# Patient Record
Sex: Female | Born: 1955 | Race: White | Hispanic: No | Smoking: Never smoker
Health system: Southern US, Community
[De-identification: ages and names within clinical notes are randomized; demographics above are authoritative.]

## PROBLEM LIST (undated history)

## (undated) DIAGNOSIS — T7840XA Allergy, unspecified, initial encounter: Secondary | ICD-10-CM

## (undated) DIAGNOSIS — E785 Hyperlipidemia, unspecified: Secondary | ICD-10-CM

## (undated) HISTORY — DX: Hyperlipidemia, unspecified: E78.5

## (undated) HISTORY — DX: Allergy, unspecified, initial encounter: T78.40XA

---

## 2002-11-22 HISTORY — PX: BREAST SURGERY: SHX581

## 2007-04-23 HISTORY — PX: CYSTECTOMY: SUR359

## 2013-02-21 ENCOUNTER — Emergency Department: Payer: Self-pay | Admitting: Emergency Medicine

## 2015-07-03 ENCOUNTER — Encounter: Payer: Self-pay | Admitting: Obstetrics and Gynecology

## 2015-07-10 ENCOUNTER — Ambulatory Visit (INDEPENDENT_AMBULATORY_CARE_PROVIDER_SITE_OTHER): Payer: Self-pay | Admitting: Family Medicine

## 2015-07-10 ENCOUNTER — Encounter (INDEPENDENT_AMBULATORY_CARE_PROVIDER_SITE_OTHER): Payer: Self-pay

## 2015-07-10 ENCOUNTER — Encounter: Payer: Self-pay | Admitting: Family Medicine

## 2015-07-10 VITALS — BP 122/70 | HR 56 | Temp 97.9°F | Resp 19 | Ht 64.0 in | Wt 133.8 lb

## 2015-07-10 DIAGNOSIS — N3001 Acute cystitis with hematuria: Secondary | ICD-10-CM

## 2015-07-10 DIAGNOSIS — J302 Other seasonal allergic rhinitis: Secondary | ICD-10-CM | POA: Insufficient documentation

## 2015-07-10 DIAGNOSIS — E785 Hyperlipidemia, unspecified: Secondary | ICD-10-CM

## 2015-07-10 DIAGNOSIS — N390 Urinary tract infection, site not specified: Secondary | ICD-10-CM | POA: Insufficient documentation

## 2015-07-10 MED ORDER — NITROFURANTOIN MONOHYD MACRO 100 MG PO CAPS: 100.0000 mg | ORAL_CAPSULE | Freq: Two times a day (BID) | ORAL | Status: DC

## 2015-07-10 NOTE — Progress Notes (Signed)
Name: Gloria Huff   MRN: [id number]    DOB: Dec 17, 1955   Date:07/10/2015       Progress Note  Subjective  Chief Complaint  Chief Complaint  Patient presents with  . Acute Visit    NP (Dr. Carman Ching) / Possible UTI  . Hyperlipidemia    Hyperlipidemia This is a chronic problem. The problem is controlled. Recent lipid tests were reviewed and are high. She has no history of chronic renal disease, diabetes or liver disease. Current antihyperlipidemic treatment includes diet change.  Urinary Tract Infection  This is a new problem. The current episode started in the past 7 days. The pain is mild. There has been no fever. Associated symptoms include frequency. Pertinent negatives include no flank pain, hematuria or nausea. She has tried increased fluids for the symptoms. Her past medical history is significant for recurrent UTIs. There is no history of kidney stones.      Past Medical History  Diagnosis Date  . Hyperlipidemia   . Allergy     Past Surgical History  Procedure Laterality Date  . Cystectomy  04/2007    Renal Cyst  . Breast surgery  11/2002    Breast mass  . Cesarean section  02/1980  . Cesarean section  03/1983    History reviewed. No pertinent family history.  Social History   Social History  . Marital Status: Unknown    Spouse Name: N/A  . Number of Children: N/A  . Years of Education: N/A   Occupational History  . Not on file.   Social History Main Topics  . Smoking status: Never Smoker   . Smokeless tobacco: Never Used  . Alcohol Use: No  . Drug Use: No  . Sexual Activity: Not on file   Other Topics Concern  . Not on file   Social History Narrative  . No narrative on file     Current outpatient prescriptions:  .  IRON, IRON,, Take by mouth., Disp: , Rfl:  .  loratadine (CLARITIN) 10 MG tablet, Take by mouth., Disp: , Rfl:  .  Multiple Vitamin tablet, Take 1 tablet by mouth daily., Disp: , Rfl:  .  OMEGA-3 FATTY ACIDS PO, Take 1 tablet by mouth  daily., Disp: , Rfl:  .  Vitamin E 400 UNITS TABS, Take 1 tablet by mouth daily., Disp: , Rfl:   No Known Allergies   Review of Systems  Gastrointestinal: Negative for nausea.  Genitourinary: Positive for frequency. Negative for hematuria and flank pain.      Objective  Filed Vitals:   07/10/15 1113  BP: 122/70  Pulse: 56  Temp: 97.9 F (36.6 C)  TempSrc: Oral  Resp: 19  Height: 5\' 4"  (1.626 m)  Weight: 133 lb 12.8 oz (60.691 kg)  SpO2: 97%    Physical Exam  Constitutional: She is oriented to person, place, and time and well-developed, well-nourished, and in no distress.  Cardiovascular: Normal rate and regular rhythm.   Pulmonary/Chest: Effort normal and breath sounds normal.  Abdominal: Soft. There is tenderness (suprapubic area.) in the suprapubic area. There is no CVA tenderness.  Neurological: She is alert and oriented to person, place, and time.  Skin: Skin is warm and dry.  Nursing note and vitals reviewed.   Assessment & Plan  1. Acute cystitis with hematuria Symptoms consistent with a urinary tract infection. Patient started on Macrobid. Obtain complete urinalysis and microscopic exam for evaluation of hematuria.  - nitrofurantoin, macrocrystal-monohydrate, (MACROBID) 100 MG capsule; Take 1  capsule (100 mg total) by mouth 2 (two) times daily.  Dispense: 14 capsule; Refill: 0 - Urinalysis, Routine w reflex microscopic (not at [facility]) - Urine culture  2. Hyperlipidemia  - Lipid Profile   Amariyon Maynes [facility]ad [facility] 07/10/2015 11:33 AM

## 2015-07-11 LAB — URINALYSIS, ROUTINE W REFLEX MICROSCOPIC
Bilirubin, UA: NEGATIVE
Glucose, UA: NEGATIVE
Ketones, UA: NEGATIVE
Nitrite, UA: NEGATIVE
PH UA: 7 (ref 5.0–7.5)
Protein, UA: NEGATIVE
RBC, UA: NEGATIVE
Specific Gravity, UA: <=1.005 — AB (ref 1.005–1.030)
Urobilinogen, Ur: 0.2 mg/dL (ref 0.2–1.0)

## 2015-07-11 LAB — LIPID PANEL

## 2015-07-11 LAB — MICROSCOPIC EXAMINATION: CASTS: NONE SEEN /LPF

## 2015-07-11 LAB — URINE CULTURE: Organism ID, Bacteria: NO GROWTH

## 2015-07-22 ENCOUNTER — Encounter: Payer: Self-pay | Admitting: Family Medicine

## 2015-07-22 ENCOUNTER — Ambulatory Visit (INDEPENDENT_AMBULATORY_CARE_PROVIDER_SITE_OTHER): Payer: Self-pay | Admitting: Family Medicine

## 2015-07-22 VITALS — BP 118/70 | HR 65 | Temp 98.2°F | Resp 18 | Ht 64.0 in | Wt 130.1 lb

## 2015-07-22 DIAGNOSIS — A499 Bacterial infection, unspecified: Secondary | ICD-10-CM

## 2015-07-22 DIAGNOSIS — N760 Acute vaginitis: Secondary | ICD-10-CM

## 2015-07-22 DIAGNOSIS — B9689 Other specified bacterial agents as the cause of diseases classified elsewhere: Secondary | ICD-10-CM

## 2015-07-22 MED ORDER — METRONIDAZOLE 500 MG PO TABS: 500.0000 mg | ORAL_TABLET | Freq: Two times a day (BID) | ORAL | Status: DC

## 2015-07-22 NOTE — Progress Notes (Signed)
Name: Gloria Huff   MRN: [id number]    DOB: [birth year]   Date:07/22/2015       Progress Note  Subjective  Chief Complaint  Chief Complaint  Patient presents with  . Follow-up    Possible UTI/Vaginal Infection, patient finished antibiotics, same symptoms persist    Abdominal Pain This is a recurrent problem. The pain is located in the suprapubic region. The quality of the pain is burning. The abdominal pain does not radiate. Associated symptoms include frequency. Pertinent negatives include no diarrhea, fever, hematuria, nausea or vomiting. Nothing aggravates the pain. Relieved by: intercourse makes it worse. She has tried antibiotics for the symptoms. The treatment provided no relief.      Past Medical History  Diagnosis Date  . Hyperlipidemia   . Allergy     Past Surgical History  Procedure Laterality Date  . Cystectomy  04/2007    Renal Cyst  . Breast surgery  11/2002    Breast mass  . Cesarean section  02/1980  . Cesarean section  03/1983    History reviewed. No pertinent family history.  Social History   Social History  . Marital Status: Unknown    Spouse Name: N/A  . Number of Children: N/A  . Years of Education: N/A   Occupational History  . Not on file.   Social History Main Topics  . Smoking status: Never Smoker   . Smokeless tobacco: Never Used  . Alcohol Use: No  . Drug Use: No  . Sexual Activity: Not on file   Other Topics Concern  . Not on file   Social History Narrative     Current outpatient prescriptions:  .  IRON, IRON,, Take by mouth., Disp: , Rfl:  .  loratadine (CLARITIN) 10 MG tablet, Take by mouth., Disp: , Rfl:  .  Multiple Vitamin tablet, Take 1 tablet by mouth daily., Disp: , Rfl:  .  OMEGA-3 FATTY ACIDS PO, Take 1 tablet by mouth daily., Disp: , Rfl:  .  Vitamin E 400 UNITS TABS, Take 1 tablet by mouth daily., Disp: , Rfl:  .  nitrofurantoin, macrocrystal-monohydrate, (MACROBID) 100 MG capsule, Take 1 capsule (100 mg total) by  mouth 2 (two) times daily. (Patient not taking: Reported on 07/22/2015), Disp: 14 capsule, Rfl: 0  No Known Allergies   Review of Systems  Constitutional: Negative for fever.  Gastrointestinal: Positive for abdominal pain. Negative for nausea, vomiting and diarrhea.  Genitourinary: Positive for frequency. Negative for hematuria.    Objective  Filed Vitals:   07/22/15 1448  BP: 118/70  Pulse: 65  Temp: 98.2 F (36.8 C)  TempSrc: Oral  Resp: 18  Height:  (1.626 m)  Weight: 130 lb 1.6 oz (59.013 kg)  SpO2: 98%    Physical Exam  Abdominal: Soft. There is tenderness in the periumbilical area and suprapubic area.  Genitourinary: Vagina exhibits no exudate and no lesion. Thin  creamy  fishy  yellow and vaginal discharge found.  Nursing note and vitals reviewed.  Assessment & Plan  1. Bacterial vaginosis Wet prep shows numerous clue cells suggestive of bacterial vaginosis. Patient started on metronidazole. Follow-up in 10 days - metroNIDAZOLE (FLAGYL) 500 MG tablet; Take 1 tablet (500 mg total) by mouth 2 (two) times daily.  Dispense: 14 tablet; Refill: 0   Kendryck Lacroix [facility]ad [facility] 07/22/2015 2:54 PM

## 2015-08-07 ENCOUNTER — Encounter: Payer: Self-pay | Admitting: Family Medicine

## 2015-08-07 ENCOUNTER — Ambulatory Visit (INDEPENDENT_AMBULATORY_CARE_PROVIDER_SITE_OTHER): Payer: Self-pay | Admitting: Family Medicine

## 2015-08-07 ENCOUNTER — Other Ambulatory Visit: Payer: Self-pay

## 2015-08-07 VITALS — BP 106/60 | HR 84 | Temp 98.8°F | Resp 16 | Ht 64.0 in | Wt 131.1 lb

## 2015-08-07 DIAGNOSIS — L920 Granuloma annulare: Secondary | ICD-10-CM | POA: Insufficient documentation

## 2015-08-07 DIAGNOSIS — N760 Acute vaginitis: Principal | ICD-10-CM

## 2015-08-07 DIAGNOSIS — D649 Anemia, unspecified: Secondary | ICD-10-CM | POA: Insufficient documentation

## 2015-08-07 DIAGNOSIS — Z23 Encounter for immunization: Secondary | ICD-10-CM

## 2015-08-07 DIAGNOSIS — N3000 Acute cystitis without hematuria: Secondary | ICD-10-CM

## 2015-08-07 DIAGNOSIS — A499 Bacterial infection, unspecified: Secondary | ICD-10-CM

## 2015-08-07 DIAGNOSIS — B9689 Other specified bacterial agents as the cause of diseases classified elsewhere: Secondary | ICD-10-CM

## 2015-08-07 LAB — POCT URINALYSIS DIPSTICK
Bilirubin, UA: NEGATIVE
Blood, UA: NEGATIVE
GLUCOSE UA: NEGATIVE
Ketones, UA: NEGATIVE
Nitrite, UA: NEGATIVE
Protein, UA: NEGATIVE
SPEC GRAV UA: 1.015
UROBILINOGEN UA: NEGATIVE
pH, UA: 7

## 2015-08-07 MED ORDER — TINIDAZOLE 250 MG PO TABS: 2.0000 g | ORAL_TABLET | Freq: Every day | ORAL | Status: DC

## 2015-08-07 MED ORDER — CIPROFLOXACIN HCL 500 MG PO TABS: 500.0000 mg | ORAL_TABLET | Freq: Two times a day (BID) | ORAL | Status: DC

## 2015-08-07 NOTE — Progress Notes (Signed)
Name: Gloria Huff   MRN: [id number]    DOB: November 05, 1956   Date:08/07/2015       Progress Note  Subjective  Chief Complaint  Chief Complaint  Patient presents with  . Vaginitis    pt had bacteria vaginosis infection(07/22/15 was last visit) and states that she is still having symptoms pt states that she is still ahving burning and chaffing feeling in vaginal area    HPI  Bacterial vaginosis bacterial vaginosis  Patient had an episode of bacterial vaginosis treated approximately 3 weeks ago with Flagyl. Her symptomatology never completely resolved she continues with vaginal discharge and odor and irritation.  Past Medical History  Diagnosis Date  . Hyperlipidemia   . Allergy     Social History  Substance Use Topics  . Smoking status: Never Smoker   . Smokeless tobacco: Never Used  . Alcohol Use: No     Current outpatient prescriptions:  .  IRON, IRON,, Take by mouth., Disp: , Rfl:  .  loratadine (CLARITIN) 10 MG tablet, Take by mouth., Disp: , Rfl:  .  Multiple Vitamin tablet, Take 1 tablet by mouth daily., Disp: , Rfl:  .  OMEGA-3 FATTY ACIDS PO, Take 1 tablet by mouth daily., Disp: , Rfl:  .  Vitamin E 400 UNITS TABS, Take 1 tablet by mouth daily., Disp: , Rfl:   No Known Allergies  Review of Systems  Constitutional: Negative.   Gastrointestinal: Positive for abdominal pain ( some suprapubic pain as noted).  Genitourinary: Positive for dysuria, urgency and frequency. Negative for flank pain.  Musculoskeletal: Negative for myalgias and neck pain.     Objective  Filed Vitals:   08/07/15 0833  BP: 106/60  Pulse: 84  Temp: 98.8 F (37.1 C)  Resp: 16  Height:  (1.626 m)  Weight: 131 lb 2 oz (59.478 kg)  SpO2: 99%     Physical Exam  Constitutional: She is well-developed, well-nourished, and in no distress.  HENT:  Head: Normocephalic.  Eyes: Pupils are equal, round, and reactive to light.  Neck: Normal range of motion.  Cardiovascular: Normal rate.    Pulmonary/Chest: Effort normal and breath sounds normal.  Abdominal: Soft. There is tenderness ( 6 suprapubic).  Musculoskeletal: Normal range of motion.  Skin: Skin is warm and dry.      Assessment & Plan   1. Bacterial vaginosis Recurrent and unresponsive to Flagyl - POCT Urinalysis Dipstick - tinidazole (TINDAMAX) 250 MG tablet; Take 8 tablets (2,000 mg total) by mouth daily with breakfast.  Dispense: 5 tablet; Refill: 5  2. Acute cystitis without hematuria Treatment with Cipro - POCT Urinalysis Dipstick - ciprofloxacin (CIPRO) 500 MG tablet; Take 1 tablet (500 mg total) by mouth 2 (two) times daily.  Dispense: 10 tablet; Refill: 0  3. Need for influenza vaccination Given today - Flu Vaccine QUAD 36+ mos PF IM (Fluarix & Fluzone Quad PF)

## 2015-08-07 NOTE — Patient Instructions (Signed)
Bacterial Vaginosis Bacterial vaginosis is a vaginal infection that occurs when the normal balance of bacteria in the vagina is disrupted. It results from an overgrowth of certain bacteria. This is the most common vaginal infection in women of childbearing age. Treatment is important to prevent complications, especially in pregnant women, as it can cause a premature delivery. CAUSES  Bacterial vaginosis is caused by an increase in harmful bacteria that are normally present in smaller amounts in the vagina. Several different kinds of bacteria can cause bacterial vaginosis. However, the reason that the condition develops is not fully understood. RISK FACTORS Certain activities or behaviors can put you at an increased risk of developing bacterial vaginosis, including:  Having a new sex partner or multiple sex partners.  Douching.  Using an intrauterine device (IUD) for contraception. Women do not get bacterial vaginosis from toilet seats, bedding, swimming pools, or contact with objects around them. SIGNS AND SYMPTOMS  Some women with bacterial vaginosis have no signs or symptoms. Common symptoms include:  Grey vaginal discharge.  A fishlike odor with discharge, especially after sexual intercourse.  Itching or burning of the vagina and vulva.  Burning or pain with urination. DIAGNOSIS  Your health care provider will take a medical history and examine the vagina for signs of bacterial vaginosis. A sample of vaginal fluid may be taken. Your health care provider will look at this sample under a microscope to check for bacteria and abnormal cells. A vaginal pH test may also be done.  TREATMENT  Bacterial vaginosis may be treated with antibiotic medicines. These may be given in the form of a pill or a vaginal cream. A second round of antibiotics may be prescribed if the condition comes back after treatment.  HOME CARE INSTRUCTIONS   Only take over-the-counter or prescription medicines as  directed by your health care provider.  If antibiotic medicine was prescribed, take it as directed. Make sure you finish it even if you start to feel better.  Do not have sex until treatment is completed.  Tell all sexual partners that you have a vaginal infection. They should see their health care provider and be treated if they have problems, such as a mild rash or itching.  Practice safe sex by using condoms and only having one sex partner. SEEK MEDICAL CARE IF:   Your symptoms are not improving after 3 days of treatment.  You have increased discharge or pain.  You have a fever. MAKE SURE YOU:   Understand these instructions.  Will watch your condition.  Will get help right away if you are not doing well or get worse. FOR MORE INFORMATION  Centers for Disease Control and Prevention, Division of STD Prevention: www.cdc.gov/std American Sexual Health Association (ASHA): www.ashastd.org  Document Released: 11/08/2005 Document Revised: 08/29/2013 Document Reviewed: 06/20/2013 ExitCare Patient Information 2015 [employer]. This information is not intended to replace advice given to you by your health care provider. Make sure you discuss any questions you have with your health care provider.  

## 2015-08-14 ENCOUNTER — Telehealth: Payer: Self-pay | Admitting: Family Medicine

## 2015-08-14 NOTE — Telephone Encounter (Signed)
Routed to Dr. Sherryll Burger patient would like advice concerning her vaginal infection

## 2015-08-14 NOTE — Telephone Encounter (Signed)
Pt would like a call back in regards to the vaginal bacterial infection.

## 2015-08-17 NOTE — Telephone Encounter (Signed)
Patient

## 2015-08-17 NOTE — Telephone Encounter (Signed)
Patient is still experiencing vaginal irritation and burning. She has finished metronidazole and ciprofloxacin. She has an appointment scheduled with gynecology for further evaluation and follow-up.

## 2015-08-20 NOTE — Telephone Encounter (Signed)
Dr. Sherryll Burger has spoken with patient and she has an appointment scheduled with gynecology for further evaluation and follow up

## 2015-09-04 ENCOUNTER — Encounter: Payer: Self-pay | Admitting: *Deleted

## 2015-09-04 ENCOUNTER — Ambulatory Visit (INDEPENDENT_AMBULATORY_CARE_PROVIDER_SITE_OTHER): Payer: Self-pay | Admitting: Family Medicine

## 2015-09-04 ENCOUNTER — Encounter: Payer: Self-pay | Admitting: Family Medicine

## 2015-09-04 VITALS — BP 110/70 | HR 81 | Temp 98.6°F | Resp 18 | Ht 64.0 in | Wt 129.7 lb

## 2015-09-04 DIAGNOSIS — K644 Residual hemorrhoidal skin tags: Secondary | ICD-10-CM | POA: Insufficient documentation

## 2015-09-04 DIAGNOSIS — K648 Other hemorrhoids: Secondary | ICD-10-CM

## 2015-09-04 DIAGNOSIS — R198 Other specified symptoms and signs involving the digestive system and abdomen: Secondary | ICD-10-CM

## 2015-09-04 DIAGNOSIS — K6289 Other specified diseases of anus and rectum: Secondary | ICD-10-CM | POA: Insufficient documentation

## 2015-09-04 NOTE — Progress Notes (Signed)
Name: Gloria Huff   MRN: [id number]    DOB: 05-02-1956   Date:09/04/2015       Progress Note  Subjective  Chief Complaint  Chief Complaint  Patient presents with  . Rectal Problems    discomfort    HPI  Rectal Discomfort Pt. Here for symptoms of rectal discomfort. Previously, she has been treated for UTI, Bacterial Vaginosis, and was referred to GYN for persistent pelvic discomfort. She has been treated with Diflucan and Hydrocortisone suppository by GYN. She feels a burning sensation in her rectum, feeling bloated and gassy. She denies any blood or mucus in stool. She feels more discomfort in her rectal area when she is on her feet. She has history of internal hemorrhoids and had the banding done in December 2015.  Past Medical History  Diagnosis Date  . Hyperlipidemia   . Allergy     Past Surgical History  Procedure Laterality Date  . Cystectomy  04/2007    Renal Cyst  . Breast surgery  11/2002    Breast mass  . Cesarean section  02/1980  . Cesarean section  03/1983    History reviewed. No pertinent family history.  Social History   Social History  . Marital Status: Unknown    Spouse Name: N/A  . Number of Children: N/A  . Years of Education: N/A   Occupational History  . Not on file.   Social History Main Topics  . Smoking status: Never Smoker   . Smokeless tobacco: Never Used  . Alcohol Use: No  . Drug Use: No  . Sexual Activity: Not on file   Other Topics Concern  . Not on file   Social History Narrative    Current outpatient prescriptions:  .  estradiol (ESTRACE) 0.1 MG/GM vaginal cream, Place 1 Applicatorful vaginally at bedtime., Disp: , Rfl:  .  hydrocortisone (ANUSOL-HC) 25 MG suppository, Place 25 mg rectally 2 (two) times daily., Disp: , Rfl:  .  IRON, IRON,, Take by mouth., Disp: , Rfl:  .  loratadine (CLARITIN) 10 MG tablet, Take by mouth., Disp: , Rfl:  .  Multiple Vitamin tablet, Take 1 tablet by mouth daily., Disp: , Rfl:  .  OMEGA-3  FATTY ACIDS PO, Take 1 tablet by mouth daily., Disp: , Rfl:  .  Vitamin E 400 UNITS TABS, Take 1 tablet by mouth daily., Disp: , Rfl:   No Known Allergies   Review of Systems  Constitutional: Negative for fever and chills.  Gastrointestinal: Positive for abdominal pain (pelvic discomfort and bloating.) and constipation. Negative for heartburn, nausea, vomiting, diarrhea and blood in stool.  Genitourinary: Negative for dysuria, urgency and hematuria.    Objective  Filed Vitals:   09/04/15 0910  BP: 110/70  Pulse: 81  Temp: 98.6 F (37 C)  TempSrc: Oral  Resp: 18  Height:  (1.626 m)  Weight: 129 lb 11.2 oz (58.832 kg)  SpO2: 97%    Physical Exam  Constitutional: She is well-developed, well-nourished, and in no distress.  Genitourinary: Rectal exam shows external hemorrhoid and mass. Guaiac negative stool.  Upon DRE, a smooth mass-like structure palpated at the 2 o' clock position, non-tender on palpation, no bleeding. Multiple non-thrombosed external hemorrhoids.  Nursing note and vitals reviewed.  Assessment & Plan  1. External hemorrhoids Referral to Gen. surgery for evaluation and management of external hemorrhoids. - Ambulatory referral to General Surgery  2. Rectal mass Referral to gastroenterology for evaluation of a mass palpated on DRE. - Ambulatory  referral to Gastroenterology - POC Hemoccult Bld/Stl (1-Cd Office Dx)   Kathi LudwigSyed Asad A. [facility] 09/04/2015 9:35 AM

## 2015-09-16 ENCOUNTER — Ambulatory Visit: Payer: Self-pay | Admitting: General Surgery

## 2015-10-22 ENCOUNTER — Encounter: Payer: Self-pay | Admitting: *Deleted

## 2016-08-10 ENCOUNTER — Other Ambulatory Visit: Payer: Self-pay | Admitting: Orthopedic Surgery

## 2016-08-10 DIAGNOSIS — M25561 Pain in right knee: Secondary | ICD-10-CM

## 2016-08-11 ENCOUNTER — Ambulatory Visit
Admission: RE | Admit: 2016-08-11 | Discharge: 2016-08-11 | Disposition: A | Discharge status: Home / Self Care | Payer: No Typology Code available for payment source | Source: Ambulatory Visit | Attending: Orthopedic Surgery | Admitting: Orthopedic Surgery

## 2016-08-11 DIAGNOSIS — M25561 Pain in right knee: Secondary | ICD-10-CM

## 2016-08-12 ENCOUNTER — Other Ambulatory Visit: Payer: Self-pay | Admitting: Orthopedic Surgery

## 2016-08-12 DIAGNOSIS — M25561 Pain in right knee: Secondary | ICD-10-CM

## 2016-08-19 ENCOUNTER — Ambulatory Visit: Payer: Self-pay | Admitting: Anesthesiology

## 2016-08-19 ENCOUNTER — Encounter
Admission: RE | Disposition: A | Discharge status: Home / Self Care | Payer: Self-pay | Source: Ambulatory Visit | Attending: Orthopedic Surgery

## 2016-08-19 ENCOUNTER — Ambulatory Visit
Admission: RE | Admit: 2016-08-19 | Discharge: 2016-08-19 | Disposition: A | Discharge status: Home / Self Care | Payer: Self-pay | Source: Ambulatory Visit | Attending: Orthopedic Surgery | Admitting: Orthopedic Surgery

## 2016-08-19 DIAGNOSIS — M1711 Unilateral primary osteoarthritis, right knee: Secondary | ICD-10-CM | POA: Insufficient documentation

## 2016-08-19 DIAGNOSIS — E785 Hyperlipidemia, unspecified: Secondary | ICD-10-CM | POA: Insufficient documentation

## 2016-08-19 HISTORY — PX: KNEE ARTHROSCOPY: SHX127

## 2016-08-19 SURGERY — ARTHROSCOPY, KNEE
Anesthesia: General | Site: Knee | Laterality: Right | Wound class: Clean

## 2016-08-19 MED ORDER — MIDAZOLAM HCL 2 MG/2ML IJ SOLN
INTRAMUSCULAR | Status: DC | PRN
  Administered 2016-08-19: 1 mg via INTRAVENOUS

## 2016-08-19 MED ORDER — FENTANYL CITRATE (PF) 100 MCG/2ML IJ SOLN
INTRAMUSCULAR | Status: AC
  Administered 2016-08-19: 25 ug via INTRAVENOUS
  Filled 2016-08-19: qty 2

## 2016-08-19 MED ORDER — BUPIVACAINE-EPINEPHRINE (PF) 0.5% -1:200000 IJ SOLN
INTRAMUSCULAR | Status: DC | PRN
  Administered 2016-08-19: 30 mL

## 2016-08-19 MED ORDER — BUPIVACAINE-EPINEPHRINE (PF) 0.5% -1:200000 IJ SOLN
INTRAMUSCULAR | Status: AC
  Filled 2016-08-19: qty 10

## 2016-08-19 MED ORDER — FENTANYL CITRATE (PF) 100 MCG/2ML IJ SOLN
25.0000 ug | INTRAMUSCULAR | Status: DC | PRN
  Administered 2016-08-19 (×3): 25 ug via INTRAVENOUS

## 2016-08-19 MED ORDER — CEFAZOLIN SODIUM-DEXTROSE 2-4 GM/100ML-% IV SOLN
INTRAVENOUS | Status: AC
  Filled 2016-08-19: qty 100

## 2016-08-19 MED ORDER — ONDANSETRON HCL 4 MG/2ML IJ SOLN
INTRAMUSCULAR | Status: DC | PRN
  Administered 2016-08-19: 4 mg via INTRAVENOUS

## 2016-08-19 MED ORDER — HYDROCODONE-ACETAMINOPHEN 5-325 MG PO TABS
ORAL_TABLET | ORAL | Status: AC
  Filled 2016-08-19: qty 1

## 2016-08-19 MED ORDER — ONDANSETRON HCL 4 MG/2ML IJ SOLN: 4.0000 mg | Freq: Once | INTRAMUSCULAR | Status: DC | PRN

## 2016-08-19 MED ORDER — HYDROCODONE-ACETAMINOPHEN 5-325 MG PO TABS: 1.0000 | ORAL_TABLET | ORAL | 0 refills | Status: AC | PRN

## 2016-08-19 MED ORDER — DEXAMETHASONE SODIUM PHOSPHATE 10 MG/ML IJ SOLN
INTRAMUSCULAR | Status: DC | PRN
  Administered 2016-08-19: 10 mg via INTRAVENOUS

## 2016-08-19 MED ORDER — CEFAZOLIN SODIUM-DEXTROSE 2-4 GM/100ML-% IV SOLN
2.0000 g | Freq: Once | INTRAVENOUS | Status: AC
  Administered 2016-08-19: 2 g via INTRAVENOUS

## 2016-08-19 MED ORDER — LACTATED RINGERS IV SOLN
INTRAVENOUS | Status: DC
  Administered 2016-08-19: 15:00:00 via INTRAVENOUS

## 2016-08-19 MED ORDER — FENTANYL CITRATE (PF) 100 MCG/2ML IJ SOLN
INTRAMUSCULAR | Status: DC | PRN
  Administered 2016-08-19 (×2): 50 ug via INTRAVENOUS

## 2016-08-19 MED ORDER — HYDROCODONE-ACETAMINOPHEN 5-325 MG PO TABS
1.0000 | ORAL_TABLET | ORAL | Status: DC | PRN
  Administered 2016-08-19: 1 via ORAL

## 2016-08-19 SURGICAL SUPPLY — 28 items
BANDAGE ACE 4X5 VEL STRL LF (GAUZE/BANDAGES/DRESSINGS) IMPLANT
BANDAGE ELASTIC 4 LF NS (GAUZE/BANDAGES/DRESSINGS) ×3 IMPLANT
BLADE FULL RADIUS 3.5 (BLADE) IMPLANT
BLADE INCISOR PLUS 4.5 (BLADE) ×3 IMPLANT
BLADE SHAVER 4.5 DBL SERAT CV (CUTTER) IMPLANT
BLADE SHAVER 4.5X7 STR FR (MISCELLANEOUS) IMPLANT
CHLORAPREP W/TINT 26ML (MISCELLANEOUS) ×3 IMPLANT
CUFF TOURN 24 STER (MISCELLANEOUS) ×3 IMPLANT
CUFF TOURN 30 STER DUAL PORT (MISCELLANEOUS) IMPLANT
GAUZE SPONGE 4X4 12PLY STRL (GAUZE/BANDAGES/DRESSINGS) ×3 IMPLANT
GLOVE SURG SYN 9.0  PF PI (GLOVE) ×2
GLOVE SURG SYN 9.0 PF PI (GLOVE) ×1 IMPLANT
GOWN SRG 2XL LVL 4 RGLN SLV (GOWNS) ×1 IMPLANT
GOWN STRL NON-REIN 2XL LVL4 (GOWNS) ×2
GOWN STRL REUS W/ TWL LRG LVL3 (GOWN DISPOSABLE) ×2 IMPLANT
GOWN STRL REUS W/TWL LRG LVL3 (GOWN DISPOSABLE) ×4
IV LACTATED RINGER IRRG 3000ML (IV SOLUTION) ×4
IV LR IRRIG 3000ML ARTHROMATIC (IV SOLUTION) ×2 IMPLANT
KIT RM TURNOVER STRD PROC AR (KITS) ×3 IMPLANT
MANIFOLD NEPTUNE II (INSTRUMENTS) ×3 IMPLANT
PACK ARTHROSCOPY KNEE (MISCELLANEOUS) ×3 IMPLANT
SET TUBE SUCT SHAVER OUTFL 24K (TUBING) ×3 IMPLANT
SET TUBE TIP INTRA-ARTICULAR (MISCELLANEOUS) ×3 IMPLANT
SUT ETHILON 4-0 (SUTURE) ×2
SUT ETHILON 4-0 FS2 18XMFL BLK (SUTURE) ×1
SUTURE ETHLN 4-0 FS2 18XMF BLK (SUTURE) ×1 IMPLANT
TUBING ARTHRO INFLOW-ONLY STRL (TUBING) ×3 IMPLANT
WAND HAND CNTRL MULTIVAC 50 (MISCELLANEOUS) ×3 IMPLANT

## 2016-08-19 NOTE — H&P (Signed)
Reviewed paper H+P, will be scanned into chart. No changes noted.  

## 2016-08-19 NOTE — Anesthesia Preprocedure Evaluation (Signed)
Anesthesia Evaluation  Patient identified by MRN, date of birth, ID band Patient awake    Reviewed: Allergy & Precautions, NPO status , Patient's Chart, lab work & pertinent test results  History of Anesthesia Complications Negative for: history of anesthetic complications  Airway Mallampati: II       Dental   Pulmonary neg pulmonary ROS,           Cardiovascular negative cardio ROS       Neuro/Psych negative neurological ROS     GI/Hepatic negative GI ROS, Neg liver ROS,   Endo/Other  negative endocrine ROS  Renal/GU negative Renal ROS     Musculoskeletal   Abdominal   Peds  Hematology negative hematology ROS (+) anemia ,   Anesthesia Other Findings   Reproductive/Obstetrics                             Anesthesia Physical Anesthesia Plan  ASA: I  Anesthesia Plan: General   Post-op Pain Management:    Induction: Intravenous  Airway Management Planned: LMA  Additional Equipment:   Intra-op Plan:   Post-operative Plan:   Informed Consent: I have reviewed the patients History and Physical, chart, labs and discussed the procedure including the risks, benefits and alternatives for the proposed anesthesia with the patient or authorized representative who has indicated his/her understanding and acceptance.     Plan Discussed with:   Anesthesia Plan Comments:         Anesthesia Quick Evaluation

## 2016-08-19 NOTE — Transfer of Care (Signed)
Immediate Anesthesia Transfer of Care Note  Patient: Gloria BrockLinda M Bevard  Procedure(s) Performed: Procedure(s): ARTHROSCOPY KNEE / REMOVAL OF LOOSE BODY AND LATERAL RELEASE (Right)  Patient Location: PACU  Anesthesia Type:General  Level of Consciousness: awake  Airway & Oxygen Therapy: Patient Spontanous Breathing and Patient connected to face mask oxygen  Post-op Assessment: Report given to RN and Post -op Vital signs reviewed and stable  Post vital signs: Reviewed  Last Vitals:  Vitals:   08/19/16 1433 08/19/16 1726  BP: 127/75 121/71  Pulse: 69 (!) 57  Resp: 16 10  Temp: 37.5 C     Last Pain:  Vitals:   08/19/16 1433  TempSrc: Tympanic  PainSc: 3          Complications: No apparent anesthesia complications

## 2016-08-19 NOTE — Anesthesia Procedure Notes (Signed)
Procedure Name: LMA Insertion Date/Time: 08/19/2016 4:23 PM Performed by: Omer JackWEATHERLY, Gloria Vandenheuvel Pre-anesthesia Checklist: Patient identified, Patient being monitored, Timeout performed, Emergency Drugs available and Suction available Patient Re-evaluated:Patient Re-evaluated prior to inductionOxygen Delivery Method: Circle system utilized Preoxygenation: Pre-oxygenation with 100% oxygen Intubation Type: IV induction Ventilation: Mask ventilation without difficulty LMA: LMA inserted LMA Size: 4.0 Tube type: Oral Number of attempts: 1 Placement Confirmation: positive ETCO2 and breath sounds checked- equal and bilateral Tube secured with: Tape Dental Injury: Teeth and Oropharynx as per pre-operative assessment

## 2016-08-19 NOTE — Progress Notes (Signed)
Elevated leg on pillows  

## 2016-08-19 NOTE — Anesthesia Postprocedure Evaluation (Signed)
Anesthesia Post Note  Patient: Gloria Huff  Procedure(s) Performed: Procedure(s) (LRB): ARTHROSCOPY KNEE / REMOVAL OF LOOSE BODY AND LATERAL RELEASE (Right)  Patient location during evaluation: PACU Anesthesia Type: General Level of consciousness: awake and alert Pain management: pain level controlled Vital Signs Assessment: post-procedure vital signs reviewed and stable Respiratory status: spontaneous breathing, nonlabored ventilation, respiratory function stable and patient connected to nasal cannula oxygen Cardiovascular status: blood pressure returned to baseline and stable Postop Assessment: no signs of nausea or vomiting Anesthetic complications: no    Last Vitals:  Vitals:   08/19/16 1751 08/19/16 1756  BP:  133/75  Pulse: 71 70  Resp: 15 17  Temp:  37.3 C    Last Pain:  Vitals:   08/19/16 1756  TempSrc:   PainSc: 3                  Cleda MccreedyJoseph K Piscitello

## 2016-08-19 NOTE — Op Note (Signed)
08/19/2016  5:27 PM  PATIENT:  Gloria BrockLinda M Huff  60 y.o. female  PRE-OPERATIVE DIAGNOSIS:  mechanical knee pain.loose body of right knee,osteoarthritis  POST-OPERATIVE DIAGNOSIS:  mechanical knee pain.loose body of right knee,osteoarthritis  PROCEDURE:  Procedure(s): ARTHROSCOPY KNEE / REMOVAL OF LOOSE BODY AND LATERAL RELEASE (Right)  SURGEON: Gloria SchullerMichael J Margeart Allender, MD  ASSISTANTS: None  ANESTHESIA:   general  EBL:  Total I/O In: 400 [I.V.:400] Out: -   BLOOD ADMINISTERED:none  DRAINS: none   LOCAL MEDICATIONS USED:  MARCAINE     SPECIMEN:  No Specimen  DISPOSITION OF SPECIMEN:  N/A  COUNTS:  YES  TOURNIQUET:   26 minutes at 300 mmHg  IMPLANTS: None  DICTATION: .Dragon Dictation patient brought the operating room and after adequate general anesthesia was obtained the right leg was prepped and draped in sterile fashion. After patient identification and timeout procedures were completed, tourniquet was raised and an inferior lateral portal was made. Initial inspection revealed complete loss of articular cartilage to the patella and a great deal of cartilage loss to the trochlea both being quite shallow and flap the patella subluxed laterally. There were several small loose bodies within the suprapatellar pouch which were subsequently removed with a shaver. Coming around medially and inferior medial portal was made and on probing the medial compartment had no articular cartilage tear although there was some fraying anteriorly. Anterior cruciate ligament is intact with a great deal of synovitis impinging view the notch and impeding view this was subsequently debrided for better visualization. The lateral compartment showed some small fraying of the posterior horn of the meniscus which was ablated with a wand but no significant tears identified. Gloria MaresShaver is used to debride anteriorly for better visualization also along better access to the patellofemoral joint for subsequent lateral release. At  this to rotate patellar pouch had the loose bodies as noted before as well as synovitis which was removed with the gutters checked there were no additional loose bodies present procedure the posterior knee did not bring out a new loose bodies or anything such as a Baker cyst. After having address the loose bodies a lateral releases carried out to decrease pressure on the patellofemoral joint minute minimizing postop pain at the patellofemoral joint. The knee was then thoroughly irrigated and the to clear. The instruments were removed and the wounds closed with simple interrupted 4-0 nylon. 30 cc of half percent Sensorcaine with epinephrine was infiltrated into the areas of portal as well as the lateral release. Pre-and post procedure pictures having been obtained. Xeroform 4 x 4's web roll and Ace wrap applied  PLAN OF CARE: Discharge to home after PACU  PATIENT DISPOSITION:  PACU - hemodynamically stable.

## 2016-08-19 NOTE — Discharge Instructions (Addendum)
Take aspirin 325 mg a day until walking more normally. Leave bandage on keep clean and dry. Weightbearing as tolerated but minimize activities throughout the weekend pain medicine as directed. If bandage slides down the leg remove entire bandage and covered to incisions with Band-Aids then reapply Ace wrap only   AMBULATORY SURGERY  DISCHARGE INSTRUCTIONS   1) The drugs that you were given will stay in your system until tomorrow so for the next 24 hours you should not:  A) Drive an automobile B) Make any legal decisions C) Drink any alcoholic beverage   2) You may resume regular meals tomorrow.  Today it is better to start with liquids and gradually work up to solid foods.  You may eat anything you prefer, but it is better to start with liquids, then soup and crackers, and gradually work up to solid foods.   3) Please notify your doctor immediately if you have any unusual bleeding, trouble breathing, redness and pain at the surgery site, drainage, fever, or pain not relieved by medication.    4) Additional Instructions:        Please contact your physician with any problems or Same Day Surgery at [phone], Monday through Friday 6 am to 4 pm, or [place] [facility] Main number at [phone].

## 2016-08-20 ENCOUNTER — Encounter: Payer: Self-pay | Admitting: Orthopedic Surgery

## 2017-07-28 IMAGING — MR MR KNEE*R* W/O CM
6 series · 40 of 40 positions shown · non-contrast
Comparison: None.

CLINICAL DATA: Medial knee pain since 08/04/2016. Knee gave way
while walking up steps at home.

EXAM:
MRI OF THE RIGHT KNEE WITHOUT CONTRAST
TECHNIQUE: Multiplanar, multisequence MR imaging of the knee was performed. No
intravenous contrast was administered.

[Series 13: PD fat-sat · axial · right · 3.0mm · 0.59mm/px · z∈[-33,+82]mm · 9 of 36 slices shown (1 of 3)]
[im 1/36]
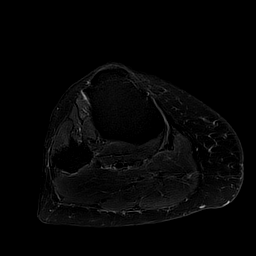
[im 5/36]
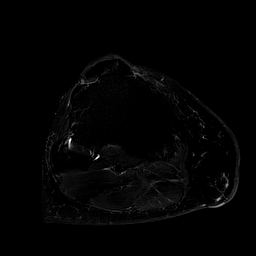
[im 9/36]
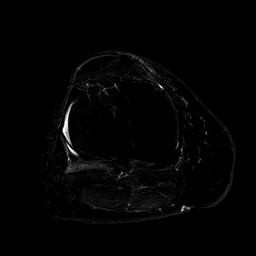
[im 14/36]
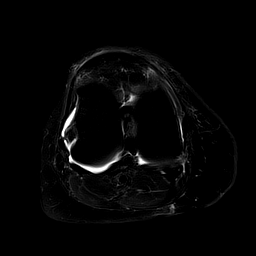
[im 18/36]
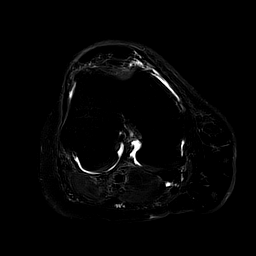
[im 22/36]
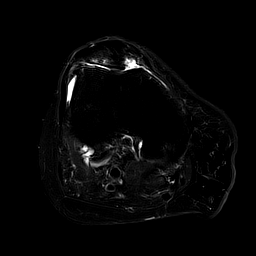
[im 27/36]
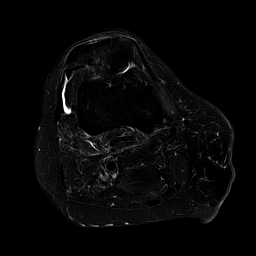
[im 31/36]
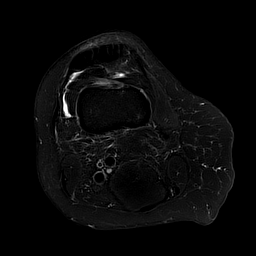
[im 36/36]
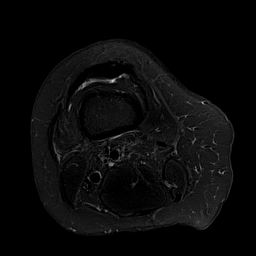

[Series 14: PD fat-sat · sagittal · right · 3.0mm · 0.59mm/px · 7 of 27 slices shown (2 of 3)]
[im 1/27]
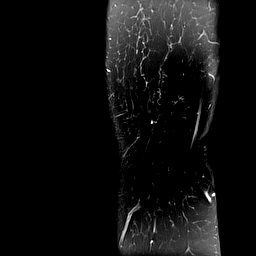
[im 5/27]
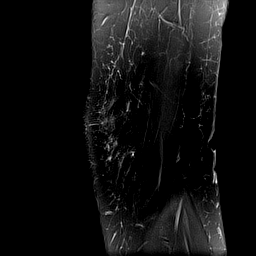
[im 9/27]
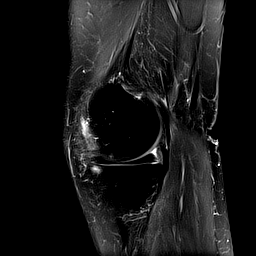
[im 14/27]
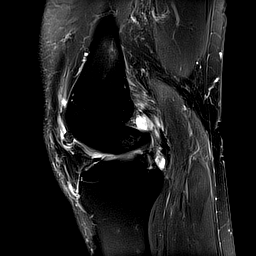
[im 18/27]
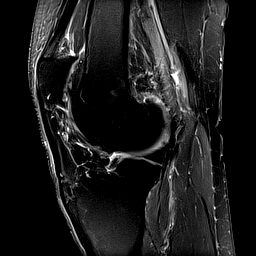
[im 22/27]
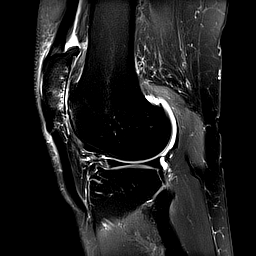
[im 27/27]
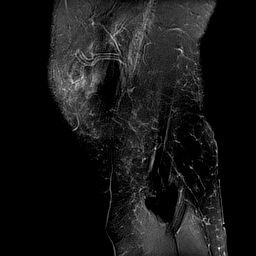

[Series 15: T1 · coronal · right · 3.0mm · 0.47mm/px · 7 of 27 slices shown]
[im 1/27]
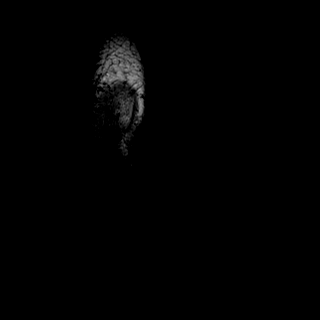
[im 5/27]
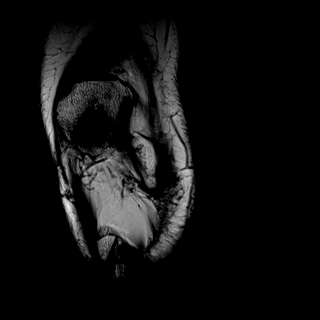
[im 9/27]
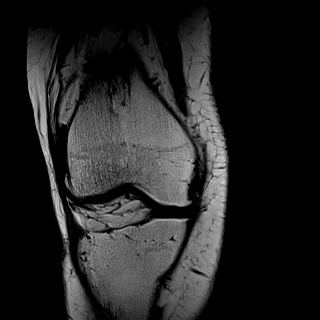
[im 14/27]
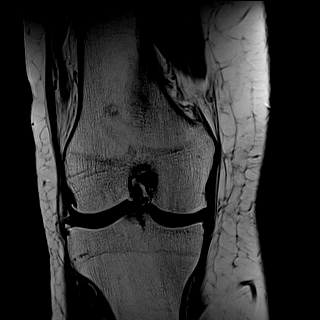
[im 18/27]
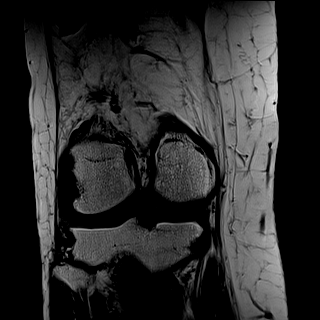
[im 22/27]
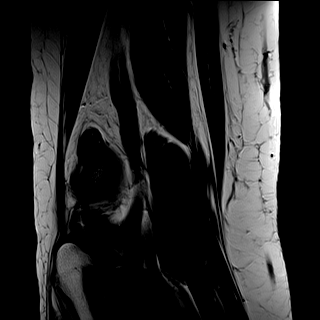
[im 27/27]
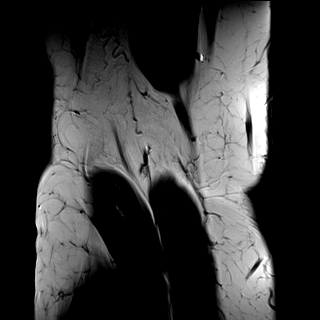

[Series 16: PD fat-sat · coronal · right · 3.0mm · 0.33mm/px · 7 of 27 slices shown (3 of 3)]
[im 1/27]
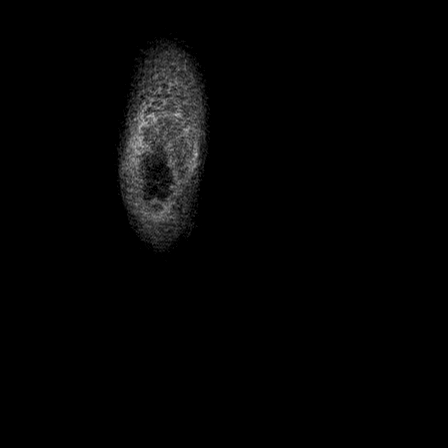
[im 5/27]
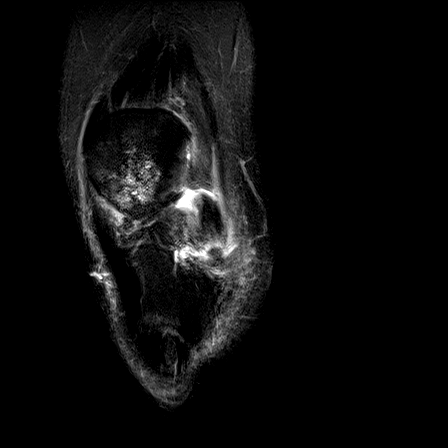
[im 9/27]
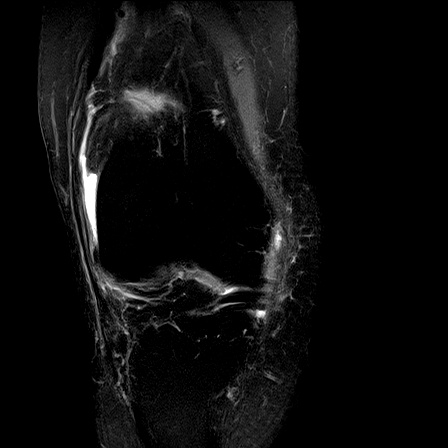
[im 14/27]
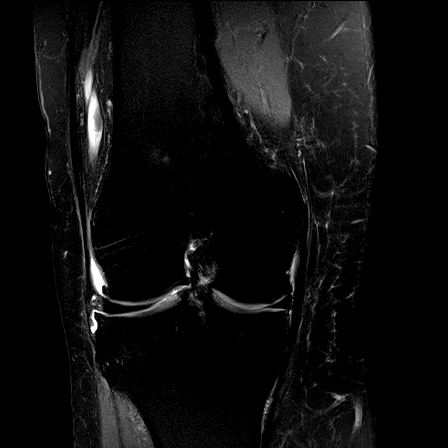
[im 18/27]
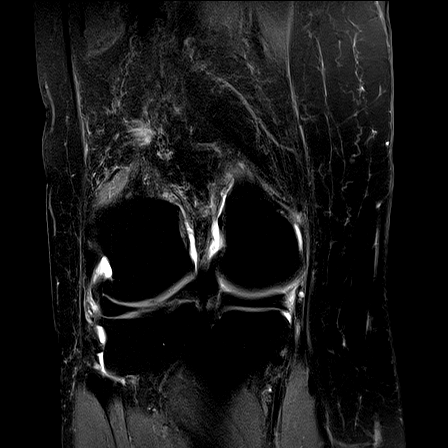
[im 22/27]
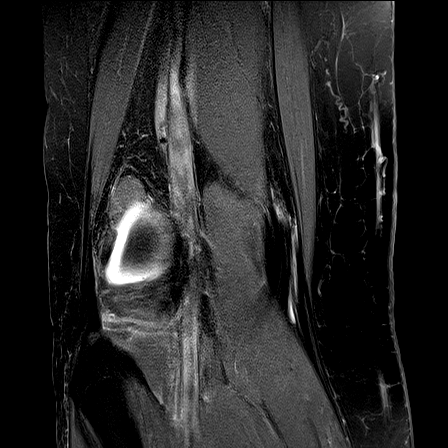
[im 27/27]
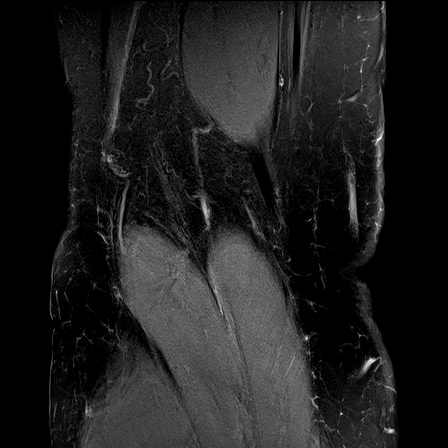

[Series 17: T2 fat-sat · coronal · right · 3.0mm · 0.39mm/px · 7 of 27 slices shown]
[im 1/27]
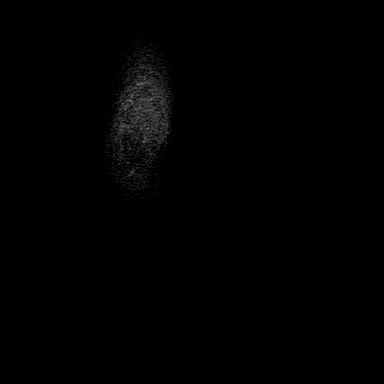
[im 5/27]
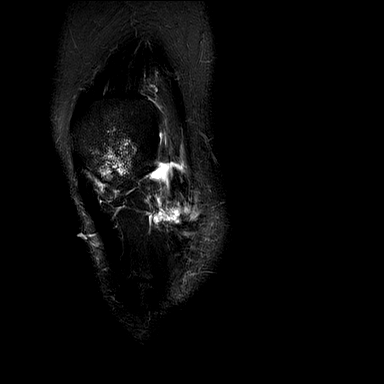
[im 9/27]
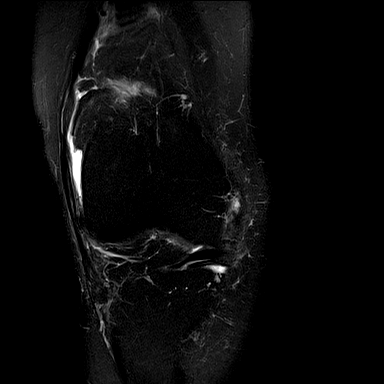
[im 14/27]
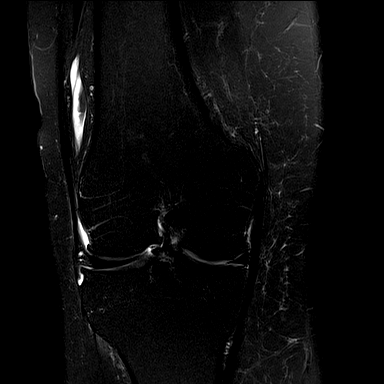
[im 18/27]
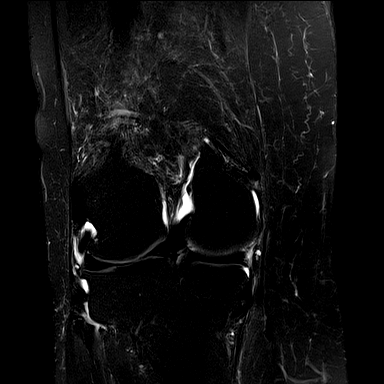
[im 22/27]
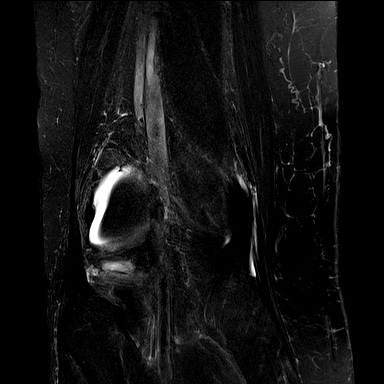
[im 27/27]
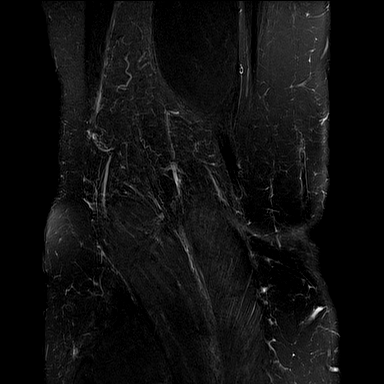

[Series 18: PD · coronal · right · 2.0mm · 0.44mm/px · 3 of 11 slices shown]
[im 1/11]
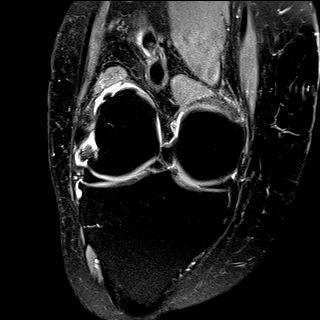
[im 6/11]
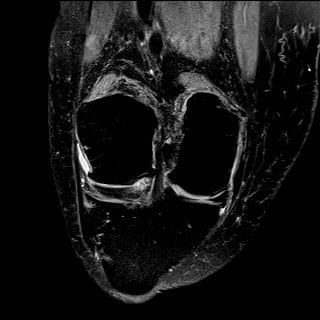
[im 11/11]
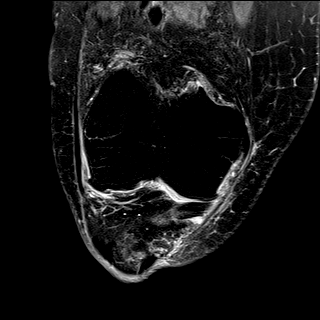

[40 of 40 positions shown; findings below may reference images not displayed]

FINDINGS: MENISCI

Medial meniscus:  Intact.

Lateral meniscus:  Intact.

LIGAMENTS

Cruciates:  Intact.

Collaterals:  Intact.

CARTILAGE

Patellofemoral: Severe degenerative chondrosis/chondromalacia with
areas of full or near full-thickness cartilage loss, joint space
narrowing, osteophytic spurring and subchondral cystic change and
edema.

Medial: Minimal degenerative chondrosis/ chondromalacia. Early
osteophytic spurring.

Lateral: Mild degenerative chondrosis/ chondromalacia. Early joint
space narrowing and spurring.

Joint:  No joint effusion.

Popliteal Fossa:  No popliteal mass or Baker's cyst.

Extensor Mechanism: The patella retinacular structures are intact.
The quadriceps and patellar tendons are intact. Moderate distal
patellar tendinopathy.

Bones:  No acute bony findings.  No osteochondral lesions.

Other: Mild thickening and edema of the iliotibial band could
suggest ITB syndrome. This also small amount of fluid along leaking
tendon.
IMPRESSION: 1. Intact ligamentous structures and no acute bony findings.
2. Advanced degenerative changes at the patellofemoral joint with
areas of grade 3 and grade 4 chondromalacia, joint space narrowing,
spurring and subchondral cystic change.
3. No meniscal tears.
4. No joint effusion or Baker's cyst.
5. Moderate distal patellar tendinopathy.
6. Mild thickening and edema of the iliotibial band suggesting ITB
syndrome.
